# Patient Record
Sex: Male | Born: 1985 | Race: White | Hispanic: No | Marital: Single | Smoking: Current every day smoker
Health system: Southern US, Community
[De-identification: ages and names within clinical notes are randomized; demographics above are authoritative.]

## PROBLEM LIST (undated history)

## (undated) DIAGNOSIS — F909 Attention-deficit hyperactivity disorder, unspecified type: Secondary | ICD-10-CM

## (undated) DIAGNOSIS — F329 Major depressive disorder, single episode, unspecified: Secondary | ICD-10-CM

## (undated) DIAGNOSIS — F32A Depression, unspecified: Secondary | ICD-10-CM

---

## 2013-06-10 ENCOUNTER — Ambulatory Visit (INDEPENDENT_AMBULATORY_CARE_PROVIDER_SITE_OTHER): Payer: BC Managed Care – PPO | Admitting: Emergency Medicine

## 2013-06-10 VITALS — BP 126/78 | HR 115 | Temp 98.7°F | Resp 17 | Ht 73.5 in | Wt 256.0 lb

## 2013-06-10 DIAGNOSIS — Z0000 Encounter for general adult medical examination without abnormal findings: Secondary | ICD-10-CM

## 2013-06-10 DIAGNOSIS — Z23 Encounter for immunization: Secondary | ICD-10-CM

## 2013-06-10 NOTE — Progress Notes (Signed)
[facility] [address], [place] [id number] [phone]  Date:  06/10/2013   Name:  Chad RummageSteven T Conrad   DOB:  12/16/1985   MRN:  [id number]  PCP:  Pcp Not In System    Chief Complaint: Employment Physical and Immunizations   History of Present Illness:  Chad RummageSteven T Conrad is a 28 y.o. very pleasant male patient who presents with the following:  Physical for school.  Denies other complaint or health concern today.   There are no active problems to display for this patient.   History reviewed. No pertinent past medical history.  History reviewed. No pertinent past surgical history.  History  Substance Use Topics  . Smoking status: Current Every Day Smoker -- 0.30 packs/day for 10 years    Types: Cigarettes  . Smokeless tobacco: Not on file  . Alcohol Use: No    Family History  Problem Relation Age of Onset  . Hyperlipidemia Father   . Hypertension Father     No Known Allergies  Medication list has been reviewed and updated.  No current outpatient prescriptions on file prior to visit.   No current facility-administered medications on file prior to visit.    Review of Systems:  As per HPI, otherwise negative.    Physical Examination: Filed Vitals:   06/10/13 1832  BP: 126/78  Pulse: 115  Temp: 98.7 F (37.1 C)  Resp: 17   Filed Vitals:   06/10/13 1832  Height: 6' 1.5" (1.867 m)  Weight: 256 lb (116.121 kg)   Body mass index is 33.31 kg/(m^2). Ideal Body Weight: Weight in (lb) to have BMI = 25: 191.7  GEN: WDWN, NAD, Non-toxic, A & O x 3 HEENT: Atraumatic, Normocephalic. Neck supple. No masses, No LAD. Ears and Nose: No external deformity. CV: RRR, No M/G/R. No JVD. No thrill. No extra heart sounds. PULM: CTA B, no wheezes, crackles, rhonchi. No retractions. No resp. distress. No accessory muscle use. ABD: S, NT, ND, +BS. No rebound. No HSM. EXTR: No c/c/e NEURO Normal gait.  PSYCH: Normally interactive. Conversant. Not  depressed or anxious appearing.  Calm demeanor.    Assessment and Plan: Needs TDAP and Varicella  Signed,  Phillips OdorJeffery Nikolos Billig, MD

## 2013-06-12 ENCOUNTER — Encounter: Payer: Self-pay | Admitting: *Deleted

## 2013-06-12 LAB — VARICELLA ZOSTER ANTIBODY, IGG: Varicella IgG: 2077 Index — ABNORMAL HIGH (ref <135.00)

## 2015-10-28 ENCOUNTER — Ambulatory Visit (HOSPITAL_COMMUNITY)
Admission: RE | Admit: 2015-10-28 | Discharge: 2015-10-28 | Disposition: A | Discharge status: Home / Self Care | Payer: Self-pay | Source: Ambulatory Visit | Attending: Occupational Medicine | Admitting: Occupational Medicine

## 2015-10-28 ENCOUNTER — Other Ambulatory Visit: Payer: Self-pay | Admitting: Occupational Medicine

## 2015-10-28 DIAGNOSIS — R7611 Nonspecific reaction to tuberculin skin test without active tuberculosis: Secondary | ICD-10-CM

## 2015-11-20 ENCOUNTER — Inpatient Hospital Stay (HOSPITAL_COMMUNITY): Payer: Self-pay

## 2015-11-20 ENCOUNTER — Emergency Department (HOSPITAL_COMMUNITY): Payer: Self-pay

## 2015-11-20 ENCOUNTER — Encounter (HOSPITAL_COMMUNITY): Payer: Self-pay | Admitting: *Deleted

## 2015-11-20 ENCOUNTER — Inpatient Hospital Stay (HOSPITAL_COMMUNITY)
Admission: EM | Admit: 2015-11-20 | Discharge: 2015-11-21 | DRG: 917 | Disposition: A | Discharge status: Home / Self Care | Payer: Self-pay | Attending: Internal Medicine | Admitting: Internal Medicine

## 2015-11-20 DIAGNOSIS — G92 Toxic encephalopathy: Secondary | ICD-10-CM | POA: Diagnosis present

## 2015-11-20 DIAGNOSIS — F1490 Cocaine use, unspecified, uncomplicated: Secondary | ICD-10-CM | POA: Diagnosis present

## 2015-11-20 DIAGNOSIS — T50904A Poisoning by unspecified drugs, medicaments and biological substances, undetermined, initial encounter: Secondary | ICD-10-CM

## 2015-11-20 DIAGNOSIS — F1190 Opioid use, unspecified, uncomplicated: Secondary | ICD-10-CM | POA: Diagnosis present

## 2015-11-20 DIAGNOSIS — Z79899 Other long term (current) drug therapy: Secondary | ICD-10-CM

## 2015-11-20 DIAGNOSIS — N179 Acute kidney failure, unspecified: Secondary | ICD-10-CM | POA: Diagnosis present

## 2015-11-20 DIAGNOSIS — F17210 Nicotine dependence, cigarettes, uncomplicated: Secondary | ICD-10-CM | POA: Diagnosis present

## 2015-11-20 DIAGNOSIS — F1590 Other stimulant use, unspecified, uncomplicated: Secondary | ICD-10-CM | POA: Diagnosis present

## 2015-11-20 DIAGNOSIS — G929 Unspecified toxic encephalopathy: Secondary | ICD-10-CM | POA: Diagnosis present

## 2015-11-20 HISTORY — DX: Major depressive disorder, single episode, unspecified: F32.9

## 2015-11-20 HISTORY — DX: Attention-deficit hyperactivity disorder, unspecified type: F90.9

## 2015-11-20 HISTORY — DX: Depression, unspecified: F32.A

## 2015-11-20 LAB — BASIC METABOLIC PANEL
ANION GAP: 9 (ref 5–15)
Anion gap: 5 (ref 5–15)
BUN: 14 mg/dL (ref 6–20)
BUN: 12 mg/dL (ref 6–20)
CALCIUM: 9 mg/dL (ref 8.9–10.3)
CHLORIDE: 109 mmol/L (ref 101–111)
CO2: 27 mmol/L (ref 22–32)
CO2: 25 mmol/L (ref 22–32)
CREATININE: 0.95 mg/dL (ref 0.61–1.24)
Calcium: 7.8 mg/dL — ABNORMAL LOW (ref 8.9–10.3)
Chloride: 101 mmol/L (ref 101–111)
Creatinine, Ser: 1.26 mg/dL — ABNORMAL HIGH (ref 0.61–1.24)
GFR calc Af Amer: >60 mL/min (ref >60)
GFR calc Af Amer: >60 mL/min (ref >60)
GFR calc non Af Amer: >60 mL/min (ref >60)
Glucose, Bld: 212 mg/dL — ABNORMAL HIGH (ref 65–99)
Glucose, Bld: 111 mg/dL — ABNORMAL HIGH (ref 65–99)
POTASSIUM: 3 mmol/L — AB (ref 3.5–5.1)
Potassium: 4 mmol/L (ref 3.5–5.1)
SODIUM: 137 mmol/L (ref 135–145)
SODIUM: 139 mmol/L (ref 135–145)

## 2015-11-20 LAB — HEPATIC FUNCTION PANEL
ALBUMIN: 4.3 g/dL (ref 3.5–5.0)
ALT: 30 U/L (ref 17–63)
AST: 47 U/L — AB (ref 15–41)
Alkaline Phosphatase: 93 U/L (ref 38–126)
BILIRUBIN TOTAL: 0.6 mg/dL (ref 0.3–1.2)
Bilirubin, Direct: <0.1 mg/dL — ABNORMAL LOW (ref 0.1–0.5)
TOTAL PROTEIN: 7.7 g/dL (ref 6.5–8.1)

## 2015-11-20 LAB — RAPID URINE DRUG SCREEN, HOSP PERFORMED
Amphetamines: POSITIVE — AB
BENZODIAZEPINES: POSITIVE — AB
Barbiturates: NOT DETECTED
COCAINE: POSITIVE — AB
Opiates: POSITIVE — AB
Tetrahydrocannabinol: NOT DETECTED

## 2015-11-20 LAB — CBC WITH DIFFERENTIAL/PLATELET
BASOS ABS: 0 10*3/uL (ref 0.0–0.1)
BASOS PCT: 0 %
EOS ABS: 0.1 10*3/uL (ref 0.0–0.7)
EOS PCT: 1 %
HCT: 46.6 % (ref 39.0–52.0)
Hemoglobin: 15.4 g/dL (ref 13.0–17.0)
Lymphocytes Relative: 17 %
Lymphs Abs: 3.7 10*3/uL (ref 0.7–4.0)
MCH: 30 pg (ref 26.0–34.0)
MCHC: 33 g/dL (ref 30.0–36.0)
MCV: 90.7 fL (ref 78.0–100.0)
Monocytes Absolute: 0.9 10*3/uL (ref 0.1–1.0)
Monocytes Relative: 4 %
Neutro Abs: 16.6 10*3/uL — ABNORMAL HIGH (ref 1.7–7.7)
Neutrophils Relative %: 78 %
PLATELETS: 226 10*3/uL (ref 150–400)
RBC: 5.14 MIL/uL (ref 4.22–5.81)
RDW: 13.2 % (ref 11.5–15.5)
WBC: 21.3 10*3/uL — ABNORMAL HIGH (ref 4.0–10.5)

## 2015-11-20 LAB — MRSA PCR SCREENING: MRSA by PCR: NEGATIVE

## 2015-11-20 LAB — CK: CK TOTAL: 246 U/L (ref 49–397)

## 2015-11-20 LAB — MAGNESIUM: MAGNESIUM: 1.7 mg/dL (ref 1.7–2.4)

## 2015-11-20 LAB — ACETAMINOPHEN LEVEL: Acetaminophen (Tylenol), Serum: <10 ug/mL — ABNORMAL LOW (ref 10–30)

## 2015-11-20 LAB — PHOSPHORUS: Phosphorus: 2.7 mg/dL (ref 2.5–4.6)

## 2015-11-20 LAB — ETHANOL

## 2015-11-20 LAB — SALICYLATE LEVEL

## 2015-11-20 MED ORDER — SODIUM CHLORIDE 0.9 % IV BOLUS (SEPSIS)
1000.0000 mL | Freq: Once | INTRAVENOUS | Status: AC
  Administered 2015-11-20: 1000 mL via INTRAVENOUS

## 2015-11-20 MED ORDER — NALOXONE HCL 2 MG/2ML IJ SOSY
1.0000 mg/h | PREFILLED_SYRINGE | INTRAVENOUS | Status: DC
  Administered 2015-11-20: 0.5 mg/h via INTRAVENOUS
  Administered 2015-11-20 – 2015-11-21 (×6): 1 mg/h via INTRAVENOUS
  Filled 2015-11-20 (×7): qty 4

## 2015-11-20 MED ORDER — NALOXONE HCL 2 MG/2ML IJ SOSY
1.0000 mg | PREFILLED_SYRINGE | Freq: Once | INTRAMUSCULAR | Status: AC
  Administered 2015-11-20: 1 mg via INTRAVENOUS
  Filled 2015-11-20: qty 2

## 2015-11-20 MED ORDER — IBUPROFEN 200 MG PO TABS
400.0000 mg | ORAL_TABLET | Freq: Four times a day (QID) | ORAL | Status: DC | PRN
  Administered 2015-11-20 – 2015-11-21 (×2): 400 mg via ORAL
  Filled 2015-11-20 (×2): qty 2

## 2015-11-20 MED ORDER — NALOXONE HCL 2 MG/2ML IJ SOSY
PREFILLED_SYRINGE | INTRAMUSCULAR | Status: AC
  Administered 2015-11-20: 1 mg
  Filled 2015-11-20: qty 2

## 2015-11-20 MED ORDER — ENOXAPARIN SODIUM 40 MG/0.4ML ~~LOC~~ SOLN
40.0000 mg | SUBCUTANEOUS | Status: DC
  Administered 2015-11-20 – 2015-11-21 (×2): 40 mg via SUBCUTANEOUS
  Filled 2015-11-20 (×2): qty 0.4

## 2015-11-20 MED ORDER — NALOXONE HCL 2 MG/2ML IJ SOSY
1.0000 mg | PREFILLED_SYRINGE | Freq: Once | INTRAMUSCULAR | Status: AC
  Administered 2015-11-20: 1 mg via INTRAVENOUS

## 2015-11-20 MED ORDER — SODIUM CHLORIDE 0.9 % IV SOLN: 250.0000 mL | INTRAVENOUS | Status: DC | PRN

## 2015-11-20 NOTE — Progress Notes (Addendum)
[facility]  Chart reviewed, admitted overnight with polysubstance abuse/encephalopathy Better this morning on narcan gtt  Advance diet when more awake Will check hiv and hepatitis panel per patient request  [facility]  Heber CarolinaBrent McQuaid, MD [place] [facility] Pager: [phone] Cell: [phone] After 3pm or if no response, call [phone]

## 2015-11-20 NOTE — ED Triage Notes (Signed)
Per EMS found unresponsive by friends, CPR performed by bystanders, Pt given Narcan 1mg  IV by EMS. Pt combative after Narcan, pt pulled out EJ and spitting. Per EMS fresh needle marks noted.

## 2015-11-20 NOTE — Consult Note (Signed)
PULMONARY / CRITICAL CARE MEDICINE   Name: Chad Conrad MRN: [id number] DOB: February 21, 1986    ADMISSION DATE:  11/20/2015 CONSULTATION DATE:  @TODAY @   REFERRING MD:    CHIEF COMPLAINT:  Encephalopathy  HISTORY OF PRESENT ILLNESS:   Chad Conrad is a 30 year old male who presents for acute encephalopathy. The patient works as an Charity fundraiser in [employer]. He presented by EMS when friends called for unresponsiveness. There is some suggestion of CPR, but upon arrival EMS found him to have a pulse, but a depressed respiratory rate. The patient was brought to [facility] ED for further evaluation. Labs with leukocytosis, no acute renal failure, or electrolyte abnormalities, however patient is obtunded with minimal response to narcan. Drip has been initiated yet the patient continues with labored sonorous breathing.   UDS positive for amphetamines, benzos, cocaine, opiates.  EKG reviewed and not consistent with ACS. No peaked T waves or TWI. CXR without cardiopulmonary abnormality. No prior ECHO or PFTs.   Upon my arrival patient is being transported to CT scan with deeply sonorus breathing and apparent aspirate material in ventimask. Upon my full examination, with vigorous agitation they patient awakes. After I tell him that he may need to be intubated, he raises his head off the bed and say 'ok ok ok, i'm up'.  PAST MEDICAL HISTORY :  He  has no past medical history on file.  PAST SURGICAL HISTORY: He  has no past surgical history on file.  No Known Allergies  No current facility-administered medications on file prior to encounter.    Current Outpatient Prescriptions on File Prior to Encounter  Medication Sig  . amphetamine-dextroamphetamine (ADDERALL XR) 20 MG 24 hr capsule Take 20 mg by mouth daily.  . DULoxetine (CYMBALTA) 30 MG capsule Take 30 mg by mouth daily.    FAMILY HISTORY:  His indicated that his mother is alive. He indicated that his father is alive.    SOCIAL HISTORY: He   reports that he has been smoking Cigarettes.  He has a 3.00 pack-year smoking history. He does not have any smokeless tobacco history on file. He reports that he does not drink alcohol or use drugs.  REVIEW OF SYSTEMS:   Unable to obtain 2/2 no friends or family present, patient is acutely encephalopathic  VITAL SIGNS: BP 110/83   Pulse 106   Resp 17   SpO2 100%   HEMODYNAMICS:  stable  VENTILATOR SETTINGS:  non rebreather  INTAKE / OUTPUT: No intake/output data recorded.  PHYSICAL EXAMINATION: Physical Exam: Pulse Rate:  [97-106] 106 (09/09 0559) Resp:  [17-29] 17 (09/09 0559) BP: (90-114)/(61-83) 110/83 (09/09 0559) SpO2:  [91 %-100 %] 100 % (09/09 0559)  General Well nourished, obese, altered.  HEENT No gross abnormalities. Large neck  Pulmonary Clear to auscultation bilaterally with no wheezes, rales or ronchi. Good effort, symmetrical expansion.   Cardiovascular Normal rate, regular rhythm. S1, s2. No m/r/g. Distal pulses palpable.  Abdomen Soft, non-tender, non-distended, positive bowel sounds, no palpable organomegaly or masses. Normoresonant to percussion.  Musculoskeletal Patient does not participate in clinical examination 2/2 mental status  Lymphatics No cervical, supraclavicular or axillary adenopathy.   Neurologic Non responsive to verbal stimuli, responsive to pain  Skin/Integuement Track marks on bilateral upper extremities   LABS:  BMET  Recent Labs Lab 11/20/15 0335  NA 137  K 3.0*  CL 101  CO2 27  BUN 14  CREATININE 1.26*  GLUCOSE 212*    Electrolytes  Recent Labs Lab  11/20/15 0335  CALCIUM 9.0    CBC  Recent Labs Lab 11/20/15 0335  WBC 21.3*  HGB 15.4  HCT 46.6  PLT 226    Coag's No results for input(s): APTT, INR in the last 168 hours.  Sepsis Markers No results for input(s): LATICACIDVEN, PROCALCITON, O2SATVEN in the last 168 hours.  ABG No results for input(s): PHART, PCO2ART, PO2ART in the last 168 hours.  Liver  Enzymes No results for input(s): AST, ALT, ALKPHOS, BILITOT, ALBUMIN in the last 168 hours.  Cardiac Enzymes No results for input(s): TROPONINI, PROBNP in the last 168 hours.  Glucose No results for input(s): GLUCAP in the last 168 hours.  Imaging Dg Chest Port 1 View  Result Date: 11/20/2015 CLINICAL DATA:  Patient found unresponsive. Status post CPR. Initial encounter. EXAM: PORTABLE CHEST 1 VIEW COMPARISON:  Chest radiograph performed 10/28/2015 FINDINGS: The lungs are well-aerated. Mild peribronchial thickening is noted. There is no evidence of focal opacification, pleural effusion or pneumothorax. The cardiomediastinal silhouette is within normal limits. No acute osseous abnormalities are seen. IMPRESSION: Mild peribronchial thickening noted.  Lungs otherwise grossly clear. Electronically Signed   By: Roanna RaiderJeffery  Chang M.D.   On: 11/20/2015 03:48    SIGNIFICANT EVENTS: Patient found unresponsive by friends. CPR administered for undetermined amount of time LINES/TUBES: PIV  DISCUSSION: Chad Conrad is a 30 year old male who presents with acute toxic encephalopathy 2/2 drug overdose. Initial examination concerning for airway protection, however the patient is now responding to verbal stimuli. ED and I have decided to defer intubation at this time.  ASSESSMENT / PLAN:  PULMONARY A: awake, alert but low threshold for intubation P:  Will monitor closely, patient may need to be intubated, and would use propofol or ketamine as patient is on narcan gtt. No signs of aspiration pneumonia- will defer antiobiotics at this time  CARDIOVASCULAR A: CPR initiated by history, but I his decreased respirations were driven by toxicology P: ECHO in am  RENAL A:  Mild AKI P:  Resuscitate with IVF  Rhabdo labs pending  GASTROINTESTINAL A:  Insert OG P:  Initiate tube feeds  INFECTIOUS A:  I suspect leukocytosis is reactive, no evidence of aspiration pneumonia or pnuemonitis P:  Repeat CBC in 8  hours  ENDOCRINE A:  No apparent endocrinopathies P: daily bmp for glucose monitoring  NEUROLOGIC A:  Toxic encephalopathy P:   Continue narcan gtt  FAMILY  - Updates:   - Inter-disciplinary family meet or Palliative Care meeting due by:  day 7   The patient is critically ill with multiple organ system failure and requires high complexity decision making for assessment and support, frequent evaluation and titration of therapies, advanced monitoring, review of radiographic studies and interpretation of complex data.   Critical Care Time devoted to patient care services, exclusive of separately billable procedures, described in this note is 35 minutes.   Greta DoomAlexis C Bensen Chadderdon DO Pulmonary and Critical Care Medicine [facility] Pager: [phone]  11/20/2015, 6:08 AM

## 2015-11-20 NOTE — ED Provider Notes (Signed)
[facility]-EMERGENCY DEPT Provider Note   CSN: [id number] Arrival date & time: 11/20/15  0309 By signing my name below, I, Bridgette HabermannMaria Tan, attest that this documentation has been prepared under the direction and in the presence of Gilda Creasehristopher J Jobany Montellano, MD. Electronically Signed: Bridgette HabermannMaria Tan, ED Scribe. 11/20/15. 3:28 AM.  History   Chief Complaint Chief Complaint  Patient presents with  . Drug Overdose   LEVEL 5 CAVEAT: HPI and ROS limited due to AMS.  HPI Comments: Chad Conrad is a 30 y.o. male who presents to the Emergency Department by EMS for an overdose just PTA. Per EMS, pt was found unresponsive by his friends and CPR was performed by bystanders. Fresh needle marks are present on his arm. Pt was given Narcan 1mg  IV by EMS. Pt unwilling to follow commands during examination. Pt appears to be in no acute distress at this time.  The history is provided by the patient and the EMS personnel. No language interpreter was used.    No past medical history on file.  There are no active problems to display for this patient.   No past surgical history on file.     Home Medications    Prior to Admission medications   Medication Sig Start Date End Date Taking? Authorizing Provider  amphetamine-dextroamphetamine (ADDERALL XR) 20 MG 24 hr capsule Take 20 mg by mouth daily.    Historical Provider, MD  DULoxetine (CYMBALTA) 30 MG capsule Take 30 mg by mouth daily.    Historical Provider, MD    Family History Family History  Problem Relation Age of Onset  . Hyperlipidemia Father   . Hypertension Father     Social History Social History  Substance Use Topics  . Smoking status: Current Every Day Smoker    Packs/day: 0.30    Years: 10.00    Types: Cigarettes  . Smokeless tobacco: Not on file  . Alcohol use No     Allergies   Review of patient's allergies indicates no known allergies.   Review of Systems Review of Systems  Unable to perform ROS: Mental status change    Physical Exam Updated Vital Signs BP 110/83   Pulse 106   Resp 17   SpO2 100%   Physical Exam  Constitutional: He appears well-developed and well-nourished. He appears lethargic. He appears distressed.  HENT:  Head: Normocephalic and atraumatic.  Right Ear: Hearing normal.  Left Ear: Hearing normal.  Nose: Nose normal.  Mouth/Throat: Oropharynx is clear and moist and mucous membranes are normal.  Eyes: Conjunctivae and EOM are normal. Pupils are equal, round, and reactive to light.  Neck: Normal range of motion. Neck supple.  Cardiovascular: Regular rhythm, S1 normal and S2 normal.  Exam reveals no gallop and no friction rub.   No murmur heard. Pulmonary/Chest: Effort normal and breath sounds normal. No respiratory distress. He exhibits no tenderness.  Abdominal: Soft. Normal appearance and bowel sounds are normal. There is no hepatosplenomegaly. There is no tenderness. There is no rebound, no guarding, no tenderness at McBurney's point and negative Murphy's sign. No hernia.  Musculoskeletal: Normal range of motion.  Neurological: He has normal strength. He appears lethargic. No cranial nerve deficit or sensory deficit. Coordination normal. GCS eye subscore is 3. GCS verbal subscore is 3. GCS motor subscore is 5.  Skin: Skin is warm, dry and intact. No rash noted. No cyanosis.  Mildly diaphoretic  Psychiatric: He is agitated. He is noncommunicative.  Nursing note and vitals reviewed.    ED  Treatments / Results  DIAGNOSTIC STUDIES: Oxygen Saturation is 98% on RA, normal by my interpretation.    COORDINATION OF CARE: 3:18 AM Discussed treatment plan with pt at bedside and pt agreed to plan.  Labs (all labs ordered are listed, but only abnormal results are displayed) Labs Reviewed  CBC WITH DIFFERENTIAL/PLATELET - Abnormal; Notable for the following:       Result Value   WBC 21.3 (*)    Neutro Abs 16.6 (*)    All other components within normal limits  BASIC METABOLIC  PANEL - Abnormal; Notable for the following:    Potassium 3.0 (*)    Glucose, Bld 212 (*)    Creatinine, Ser 1.26 (*)    All other components within normal limits  URINE RAPID DRUG SCREEN, HOSP PERFORMED - Abnormal; Notable for the following:    Opiates POSITIVE (*)    Cocaine POSITIVE (*)    Benzodiazepines POSITIVE (*)    Amphetamines POSITIVE (*)    All other components within normal limits  ACETAMINOPHEN LEVEL - Abnormal; Notable for the following:    Acetaminophen (Tylenol), Serum <10 (*)    All other components within normal limits  HEPATIC FUNCTION PANEL - Abnormal; Notable for the following:    AST 47 (*)    Bilirubin, Direct <0.1 (*)    All other components within normal limits  ETHANOL  SALICYLATE LEVEL    EKG  EKG Interpretation None       Radiology Dg Chest Port 1 View  Result Date: 11/20/2015 CLINICAL DATA:  Patient found unresponsive. Status post CPR. Initial encounter. EXAM: PORTABLE CHEST 1 VIEW COMPARISON:  Chest radiograph performed 10/28/2015 FINDINGS: The lungs are well-aerated. Mild peribronchial thickening is noted. There is no evidence of focal opacification, pleural effusion or pneumothorax. The cardiomediastinal silhouette is within normal limits. No acute osseous abnormalities are seen. IMPRESSION: Mild peribronchial thickening noted.  Lungs otherwise grossly clear. Electronically Signed   By: Roanna Raider M.D.   On: 11/20/2015 03:48    Procedures Procedures (including critical care time)  Medications Ordered in ED Medications  naloxone (NARCAN) 4 mg in dextrose 5 % 250 mL infusion (1 mg/hr Intravenous Rate/Dose Change 11/20/15 0509)  naloxone [facility]) 2 MG/2ML injection (1 mg  Given 11/20/15 0337)  naloxone [facility]) injection 1 mg (1 mg Intravenous Given 11/20/15 0405)  naloxone (NARCAN) injection 1 mg (1 mg Intravenous Given 11/20/15 0509)     Initial Impression / Assessment and Plan / ED Course  I have reviewed the triage vital signs and the  nursing notes.  Pertinent labs & imaging results that were available during my care of the patient were reviewed by me and considered in my medical decision making (see chart for details).  Clinical Course   Patient was brought to emergency department by ambulance.Patient was found on the ground by friends. He reportedly received CPR by bystanders before EMS arrived. It is unknown how long he was  Lying on the floor.EMS identified obvious track marks on the patient.He was given Narcan and did have some arousability but became extremely agitated. At arrival to the ER patient was  Exhibiting severe somnolence, snoring respirations.He did respond to deep stimuli and was clearing his airway and spitting intermittently, therefore did not require immediate intubation.He was administered boluses of Narcan with only partial response. He was then placed on a Narcan drip which has been titrated upwards. He is more arousable, but still extremely sedated. He will require hospitalization. Critical care has been consulted  and will evaluate the patient.  CRITICAL CARE Performed by: Gilda Crease   Total critical care time: 35 minutes  Critical care time was exclusive of separately billable procedures and treating other patients.  Critical care was necessary to treat or prevent imminent or life-threatening deterioration.  Critical care was time spent personally by me on the following activities: development of treatment plan with patient and/or surrogate as well as nursing, discussions with consultants, evaluation of patient's response to treatment, examination of patient, obtaining history from patient or surrogate, ordering and performing treatments and interventions, ordering and review of laboratory studies, ordering and review of radiographic studies, pulse oximetry and re-evaluation of patient's condition.   Final Clinical Impressions(s) / ED Diagnoses   Final diagnoses:  Overdose,  undetermined intent, initial encounter    New Prescriptions New Prescriptions   No medications on file  I personally performed the services described in this documentation, which was scribed in my presence. The recorded information has been reviewed and is accurate.     Gilda Crease, MD 11/20/15 [phone]

## 2015-11-20 NOTE — ED Notes (Signed)
Patient transported to CT 

## 2015-11-20 NOTE — Progress Notes (Signed)
Pt requested to be confidential status.  Informed Dr. Kendrick FriesMcquaid.  Erick Blinksuchman, Gianlucca Szymborski D, RN

## 2015-11-20 NOTE — Progress Notes (Signed)
eLink Physician-Brief Progress Note Patient Name: Chad Conrad DOB: 08/03/1985 MRN: [id number]   Date of Service  11/20/2015  HPI/Events of Note  Patient c/o chest pain related to CPR.  eICU Interventions  Will order: 1. Motrin 400 mg PO Q 6 hours PRN pain.      Intervention Category Intermediate Interventions: Pain - evaluation and management  Chad Conrad,Chad Conrad 11/20/2015, 7:42 PM

## 2015-11-20 NOTE — ED Notes (Signed)
Entered to see pt, pt opened eyes spontaneously to verbal conversation. When asked questions pt moans. Unable to assess orientation status related to pt moaning. Pt lifts self when asked to sit forward for chest x-ray. Chest x-ray in process pt to be transported in 20 minutes to floor.

## 2015-11-21 ENCOUNTER — Encounter (HOSPITAL_COMMUNITY): Payer: Self-pay

## 2015-11-21 DIAGNOSIS — G92 Toxic encephalopathy: Secondary | ICD-10-CM

## 2015-11-21 LAB — HIV ANTIBODY (ROUTINE TESTING W REFLEX): HIV Screen 4th Generation wRfx: NONREACTIVE

## 2015-11-21 MED ORDER — BUPROPION HCL ER (XL) 150 MG PO TB24: 150.0000 mg | ORAL_TABLET | Freq: Every day | ORAL | Status: DC

## 2015-11-21 MED ORDER — BUPROPION HCL ER (XL) 300 MG PO TB24
300.0000 mg | ORAL_TABLET | Freq: Every day | ORAL | Status: DC
Start: 2015-11-21 — End: 2015-11-21
  Administered 2015-11-21: 300 mg via ORAL
  Filled 2015-11-21: qty 1

## 2015-11-21 NOTE — Discharge Summary (Signed)
Discharge Summary  Chad KernSteven T XXXAndreoli WUJ:[id number] DOB: 09/16/1985  PCP: Pcp Not In System  Admit date: 11/20/2015 Discharge date: 11/21/2015   Recommendations for Outpatient Follow-up:  1. PCP 1-2 weeks   Discharge Diagnoses:  Active Hospital Problems   Diagnosis Date Noted  . Encephalopathy, toxic 11/20/2015    Resolved Hospital Problems   Diagnosis Date Noted Date Resolved  No resolved problems to display.    Discharge Condition: Stable   Diet recommendation: Reg   Vitals:   11/21/15 0410 11/21/15 0600  BP: 114/88 137/85  Pulse: 90 96  Resp: (!) 28 (!) 22  Temp: 98 F (36.7 C)     History of present illness and Hospital course:  [id number] male found down and unresponsive by friends after ingesting various recreational drugs. He admits to recent heroin, cocaine and meth use. He was admitted to ICU service and monitored closely, was placed on Narcan drip. This was discontinued on 9/10 at 8am. He is doing well. Patient was strongly counseled on the risks of continued drug abuse. He is not suicidal. He requested visit from social work for resources to help him quit drug abuse. He will be weaned off of supplemental oxygen and will be discharged home today at 5pm if he is doing well.  Hospital Course:  Active Problems:   Encephalopathy, toxic   Procedures:  None   Consultations:  None   Discharge Exam: BP 137/85 (BP Location: Left Wrist)   Pulse 96   Temp 98 F (36.7 C) (Oral)   Resp (!) 22   Ht 5\' 10"  (1.778 m)   Wt 135.9 kg (299 lb 9.7 oz)   SpO2 93%   BMI 42.99 kg/m  General:  Alert, oriented, calm, in no acute distress  Eyes: pupils round and reactive to light and accomodation, clear sclerea Neck: supple, no masses, trachea mildline  Cardiovascular: RRR, no murmurs or rubs, no peripheral edema  Respiratory: clear to auscultation bilaterally, no wheezes, no crackles  Abdomen: soft, nontender, nondistended, normal bowel tones heard  Skin: dry, no rashes    Musculoskeletal: no joint effusions, normal range of motion  Psychiatric: appropriate affect, normal speech  Neurologic: extraocular muscles intact, clear speech, moving all extremities with intact sensorium   Discharge Instructions You were cared for by a hospitalist during your hospital stay. If you have any questions about your discharge medications or the care you received while you were in the hospital after you are discharged, you can call the unit and asked to speak with the hospitalist on call if the hospitalist that took care of you is not available. Once you are discharged, your primary care physician will handle any further medical issues. Please note that NO REFILLS for any discharge medications will be authorized once you are discharged, as it is imperative that you return to your primary care physician (or establish a relationship with a primary care physician if you do not have one) for your aftercare needs so that they can reassess your need for medications and monitor your lab values.  Discharge Instructions    Diet - low sodium heart healthy    Complete by:  As directed   Increase activity slowly    Complete by:  As directed       Medication List    TAKE these medications   amphetamine-dextroamphetamine 20 MG tablet Commonly known as:  ADDERALL Take 10-20 mg by mouth 3 (three) times daily. Take 20 mgs twice daily and then 10mg s every evening  buPROPion 300 MG 24 hr tablet Commonly known as:  WELLBUTRIN XL Take 300 mg by mouth daily.      No Known Allergies    The results of significant diagnostics from this hospitalization (including imaging, microbiology, ancillary and laboratory) are listed below for reference.    Significant Diagnostic Studies: Dg Chest 2 View  Result Date: 10/29/2015 CLINICAL DATA:  Positive TB test. EXAM: CHEST  2 VIEW COMPARISON:  None. FINDINGS: The heart size and mediastinal contours are within normal limits. Both lungs are clear. The  visualized skeletal structures are unremarkable. IMPRESSION: No active cardiopulmonary disease. Electronically Signed   By: Charlett Nose M.D.   On: 10/29/2015 08:25   Ct Head Wo Contrast  Result Date: 11/20/2015 CLINICAL DATA:  Patient unresponsive, acute onset. Initial encounter. EXAM: CT HEAD WITHOUT CONTRAST CT CERVICAL SPINE WITHOUT CONTRAST TECHNIQUE: Multidetector CT imaging of the head and cervical spine was performed following the standard protocol without intravenous contrast. Multiplanar CT image reconstructions of the cervical spine were also generated. COMPARISON:  None. FINDINGS: CT HEAD FINDINGS Brain: No evidence of acute infarction, hemorrhage, hydrocephalus, extra-axial collection or mass lesion/mass effect. Mildly decreased attenuation at the right temporal lobe is thought to be artifactual in nature. The posterior fossa, including the cerebellum, brainstem and fourth ventricle, is within normal limits. The third and lateral ventricles, and basal ganglia are unremarkable in appearance. The cerebral hemispheres are symmetric in appearance, with normal gray-white differentiation. No mass effect or midline shift is seen. Vascular: No hyperdense vessel or unexpected calcification. Skull: There is no evidence of fracture; visualized osseous structures are unremarkable in appearance. Sinuses/Orbits: The orbits are within normal limits. The paranasal sinuses and mastoid air cells are well-aerated. Other: No significant soft tissue abnormalities are seen. CT CERVICAL SPINE FINDINGS Alignment: Likely normal, though not well assessed due to motion artifact. Skull base and vertebrae: No acute fracture. No primary bone lesion or focal pathologic process. Soft tissues and spinal canal: No prevertebral fluid or swelling. No visible canal hematoma. Disc levels:  Intervertebral disc spaces are grossly preserved. Upper chest: Fluffy biapical airspace opacities raise concern for pulmonary edema. The thyroid gland  is unremarkable in appearance. Other: No additional soft tissue abnormalities are characterized. IMPRESSION: 1. No evidence of traumatic intracranial injury or fracture. 2. No evidence of fracture or subluxation along the cervical spine, though evaluation is limited due to motion artifact. 3. Fluffy biapical airspace opacities raise concern for pulmonary edema. Electronically Signed   By: Roanna Raider M.D.   On: 11/20/2015 07:18   Ct Cervical Spine Wo Contrast  Result Date: 11/20/2015 CLINICAL DATA:  Patient unresponsive, acute onset. Initial encounter. EXAM: CT HEAD WITHOUT CONTRAST CT CERVICAL SPINE WITHOUT CONTRAST TECHNIQUE: Multidetector CT imaging of the head and cervical spine was performed following the standard protocol without intravenous contrast. Multiplanar CT image reconstructions of the cervical spine were also generated. COMPARISON:  None. FINDINGS: CT HEAD FINDINGS Brain: No evidence of acute infarction, hemorrhage, hydrocephalus, extra-axial collection or mass lesion/mass effect. Mildly decreased attenuation at the right temporal lobe is thought to be artifactual in nature. The posterior fossa, including the cerebellum, brainstem and fourth ventricle, is within normal limits. The third and lateral ventricles, and basal ganglia are unremarkable in appearance. The cerebral hemispheres are symmetric in appearance, with normal gray-white differentiation. No mass effect or midline shift is seen. Vascular: No hyperdense vessel or unexpected calcification. Skull: There is no evidence of fracture; visualized osseous structures are unremarkable in appearance. Sinuses/Orbits: The orbits  are within normal limits. The paranasal sinuses and mastoid air cells are well-aerated. Other: No significant soft tissue abnormalities are seen. CT CERVICAL SPINE FINDINGS Alignment: Likely normal, though not well assessed due to motion artifact. Skull base and vertebrae: No acute fracture. No primary bone lesion or  focal pathologic process. Soft tissues and spinal canal: No prevertebral fluid or swelling. No visible canal hematoma. Disc levels:  Intervertebral disc spaces are grossly preserved. Upper chest: Fluffy biapical airspace opacities raise concern for pulmonary edema. The thyroid gland is unremarkable in appearance. Other: No additional soft tissue abnormalities are characterized. IMPRESSION: 1. No evidence of traumatic intracranial injury or fracture. 2. No evidence of fracture or subluxation along the cervical spine, though evaluation is limited due to motion artifact. 3. Fluffy biapical airspace opacities raise concern for pulmonary edema. Electronically Signed   By: Roanna Raider M.D.   On: 11/20/2015 07:18   Dg Chest Port 1 View  Result Date: 11/20/2015 CLINICAL DATA:  Found unresponsive. CPR performed by by standard. Patient combative. EXAM: PORTABLE CHEST 1 VIEW COMPARISON:  11/20/2015 FINDINGS: Semi-erect AP portable chest. There is interim development of bilateral right greater than left pulmonary airspace opacities. No significant pleural effusion. Cardiomediastinal silhouette is nonenlarged. There is no pneumothorax. IMPRESSION: 1. Interim development of mild to moderate right greater than left diffuse pulmonary opacities. Findings could be secondary to edema, ARDS, or pulmonary hemorrhage. Electronically Signed   By: Jasmine Pang M.D.   On: 11/20/2015 08:28   Dg Chest Port 1 View  Result Date: 11/20/2015 CLINICAL DATA:  Patient found unresponsive. Status post CPR. Initial encounter. EXAM: PORTABLE CHEST 1 VIEW COMPARISON:  Chest radiograph performed 10/28/2015 FINDINGS: The lungs are well-aerated. Mild peribronchial thickening is noted. There is no evidence of focal opacification, pleural effusion or pneumothorax. The cardiomediastinal silhouette is within normal limits. No acute osseous abnormalities are seen. IMPRESSION: Mild peribronchial thickening noted.  Lungs otherwise grossly clear.  Electronically Signed   By: Roanna Raider M.D.   On: 11/20/2015 03:48    Microbiology: Recent Results (from the past 240 hour(s))  MRSA PCR Screening     Status: None   Collection Time: 11/20/15  8:44 AM  Result Value Ref Range Status   MRSA by PCR NEGATIVE NEGATIVE Final    Comment:        The GeneXpert MRSA Assay (FDA approved for NASAL specimens only), is one component of a comprehensive MRSA colonization surveillance program. It is not intended to diagnose MRSA infection nor to guide or monitor treatment for MRSA infections.      Labs: Basic Metabolic Panel:  Recent Labs Lab 11/20/15 0335 11/20/15 1001  NA 137 139  K 3.0* 4.0  CL 101 109  CO2 27 25  GLUCOSE 212* 111*  BUN 14 12  CREATININE 1.26* 0.95  CALCIUM 9.0 7.8*  MG  --  1.7  PHOS  --  2.7   Liver Function Tests:  Recent Labs Lab 11/20/15 0335  AST 47*  ALT 30  ALKPHOS 93  BILITOT 0.6  PROT 7.7  ALBUMIN 4.3   No results for input(s): LIPASE, AMYLASE in the last 168 hours. No results for input(s): AMMONIA in the last 168 hours. CBC:  Recent Labs Lab 11/20/15 0335  WBC 21.3*  NEUTROABS 16.6*  HGB 15.4  HCT 46.6  MCV 90.7  PLT 226   Cardiac Enzymes:  Recent Labs Lab 11/20/15 0355  CKTOTAL 246   BNP: BNP (last 3 results) No results for input(s): BNP  in the last 8760 hours.  ProBNP (last 3 results) No results for input(s): PROBNP in the last 8760 hours.  CBG: No results for input(s): GLUCAP in the last 168 hours.  Time spent: 34 minutes were spent in preparing this discharge including medication reconciliation, counseling, and coordination of care.  Signed:  Doshia Dalia Vergie Living  [facility] 11/21/2015, 12:05 PM

## 2015-11-21 NOTE — Progress Notes (Signed)
Discharge instructions reviewed with patient and his father utilizing teach back method no questions at this time. Patient being discharged to home with father.

## 2015-11-21 NOTE — Progress Notes (Signed)
Patient's oxygen level drops to 85% on room air when sleeping oxygen applied at 2 liters via nasal cannula. Patient states," he might have  some undiagnosed sleep apnea"

## 2015-11-21 NOTE — Clinical Social Work Note (Signed)
Clinical Social Work Assessment  Patient Details  Name: Chad Conrad MRN: [id number] Date of Birth: [birth year]  Date of referral:  11/21/15               Reason for consult:  Substance Use/ETOH Abuse                Permission sought to share information with:  Case Manager, Family Supports, Other (significant other ) Permission granted to share information::  Yes, Verbal Permission Granted  Name::        Agency::     Relationship::     Contact Information:     Housing/Transportation Living arrangements for the past 2 months:  Single Family Home Source of Information:  Patient Patient Interpreter Needed:  None Criminal Activity/Legal Involvement Pertinent to Current Situation/Hospitalization:  No - Comment as needed Significant Relationships:  Significant Other, Parents Lives with:  Self Do you feel safe going back to the place where you live?  Yes Need for family participation in patient care:  No (Coment)  Care giving concerns:  Patient shares that he has been a recovering herroine addict for 12 years, however used recently 2x.    Social Worker assessment / plan:  CSW met with patient at bedside who gave permission for csw to speak with him in front of father and signifcant other. Pt shares that he has been sober for 12 years of heroine however relapsed recently. Resident shared that he has been in multiple treatment venues. He recently began a job here at [employer] in August. He has been struggling hte past year and half with some mental health issues. Pt denies any SI/HI/AH/VH. Patient shares that he is interested in getting treatment possibly here or in [place]. Pt shares that he is not interested in following up with [facility] due to a past experience. Patient interested in national hotline 1-800-662-HELP [phone])510-6747) incase he does return to [place] with his dad.  Employment status:  [employer] information:  Managed Care PT Recommendations:    Information / Referral  to community resources:   Susbtance abuse outpatient and inpatient treatment  Patient/Family's Response to care:  Patient and patient family receptive of CSW help and support. Patient is unsure of where he stands with his signifcant other, and whether or not he will return to [place] with this father and seek treatment. Pt is concerned about his job.  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   Emotional Assessment Appearance:  Appears stated age Attitude/Demeanor/Rapport:  Apprehensive Affect (typically observed):  Accepting, Overwhelmed, Hopeful, Stable Orientation:  Oriented to Self, Oriented to Place, Oriented to  Time, Oriented to Situation Alcohol / Substance use:    Psych involvement (Current and /or in the community):  No (Comment)  Discharge Needs  Concerns to be addressed:  Coping/Stress Concerns, Mental Health Concerns Readmission within the last 30 days:  No Current discharge risk:  Substance Abuse Barriers to Discharge:  No Barriers Identified   Jaclynne Baldo, [place], LCSW 11/21/2015, 5:10 PM

## 2015-11-21 NOTE — Care Management Note (Signed)
Case Management Note  Patient Details  Name: Chad Conrad MRN: [id number] Date of Birth: 02/18/1986  Subjective/Objective:      Substance Use/ETOH abuse              Action/Plan: Discharge Planning: AVS reviewed:  NCM spoke to pt and father at bedside. Pt's girlfriend was in the room. Pt states he has PCP to follow up. Pt states he has insurance effective. Explained to pt that admissions would need copy of insurance card. Pt states he did not receive card. Insurance was effective 11/12/2015.   PCP- Sherlyn LickKelly Furim NP  Expected Discharge Date:  11/21/2015               Expected Discharge Plan:  Home/Self Care  In-House Referral:  NA  Discharge planning Services  CM Consult, [facility]  Post Acute Care Choice:  NA Choice offered to:     DME Arranged:  N/A DME Agency:  NA  HH Arranged:  NA HH Agency:  NA  Status of Service:  Completed, signed off  If discussed at Long Length of Stay Meetings, dates discussed:    Additional Comments:  Elliot CousinShavis, Tylique Aull Ellen, RN 11/21/2015, 5:17 PM

## 2015-11-22 LAB — HEPATITIS PANEL, ACUTE
HEP A IGM: NEGATIVE
Hep B C IgM: NEGATIVE
Hepatitis B Surface Ag: NEGATIVE

## 2017-12-06 IMAGING — CT CT HEAD W/O CM
6 of 14 series · 14 of 47 positions shown, 15 images · non-contrast
Comparison: None.

CLINICAL DATA: Patient unresponsive, acute onset. Initial
encounter.

EXAM:
CT HEAD WITHOUT CONTRAST
CT CERVICAL SPINE WITHOUT CONTRAST
TECHNIQUE: Multidetector CT imaging of the head and cervical spine was
performed following the standard protocol without intravenous
contrast. Multiplanar CT image reconstructions of the cervical spine
were also generated.

[Series 9: c-spine st · axial · 0.27mm/px · z∈[-296,-202]mm · 3 of 95 slices shown (1 of 2)]
[im 24/95  brain]
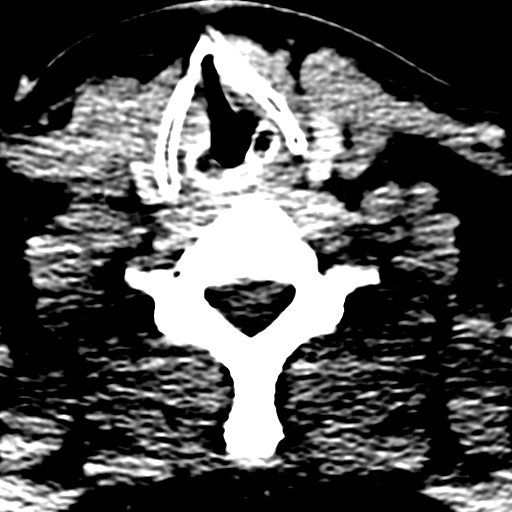
[im 48/95  brain]
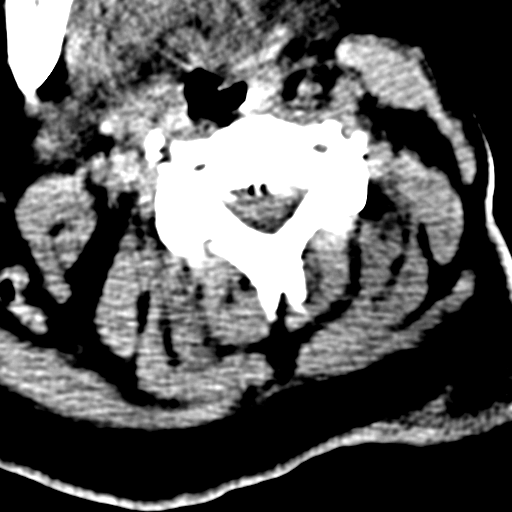
[im 71/95  brain]
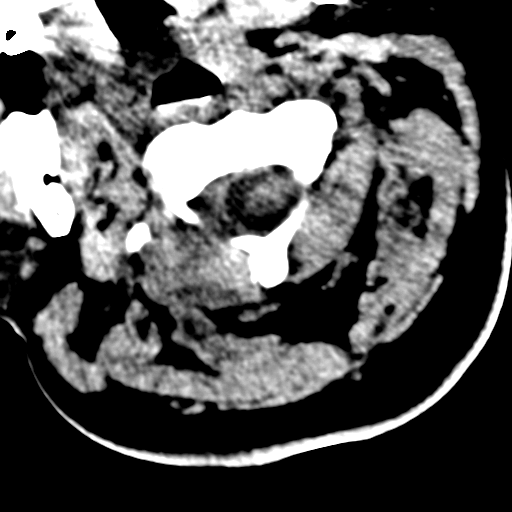

[Series 12: c-spine st · axial · 0.27mm/px · z∈[-274,-194]mm · 3 of 80 slices shown (2 of 2)]
[im 20/80  brain]
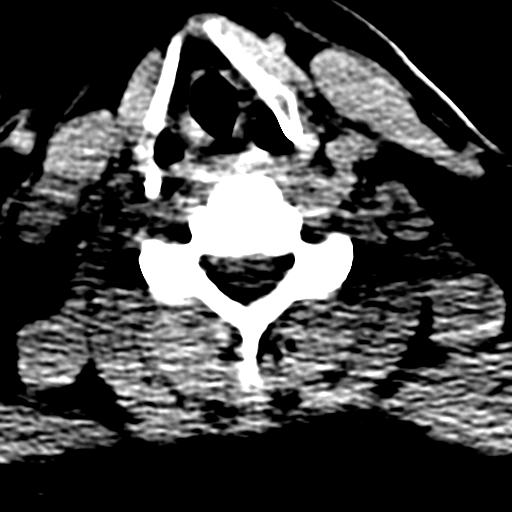
[im 40/80  brain]
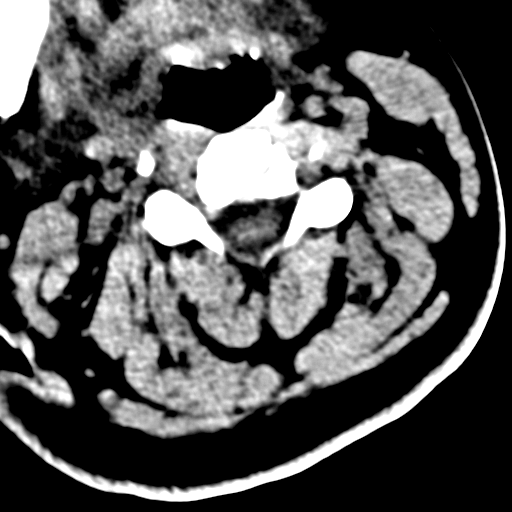
[im 60/80  brain]
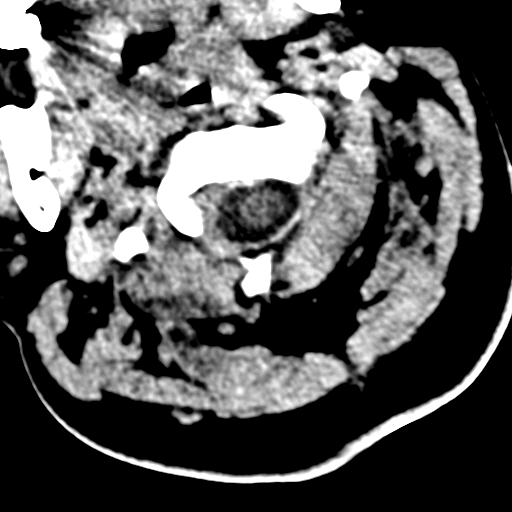

[Series 16: sagittal · sagittal · 0.32mm/px · 1 of 49 slices shown]
[im 25/49  brain]
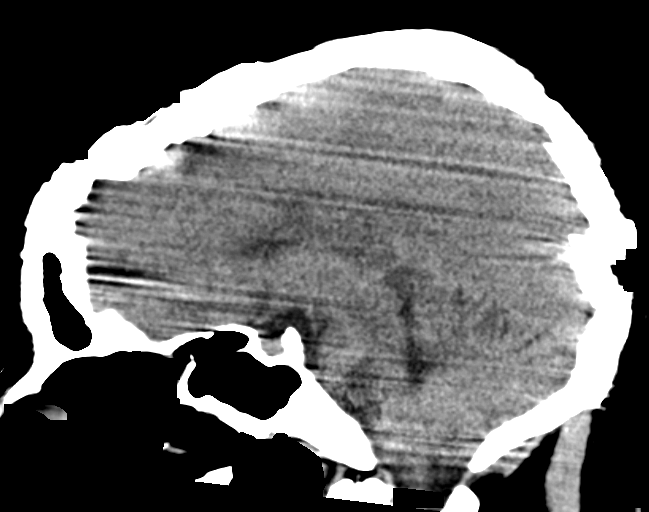

[Series 19: axial recon · axial · 0.23mm/px · z∈[-315,-233]mm · 3 of 96 slices shown, 4 images (1 of 2)]
[im 24/96  brain]
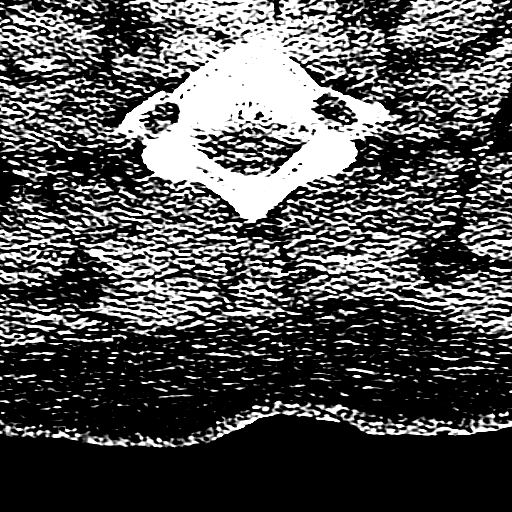
[im 24/96  bone]
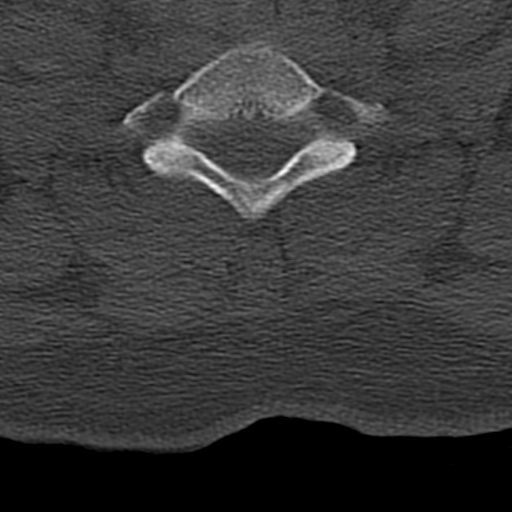
[im 48/96  brain]
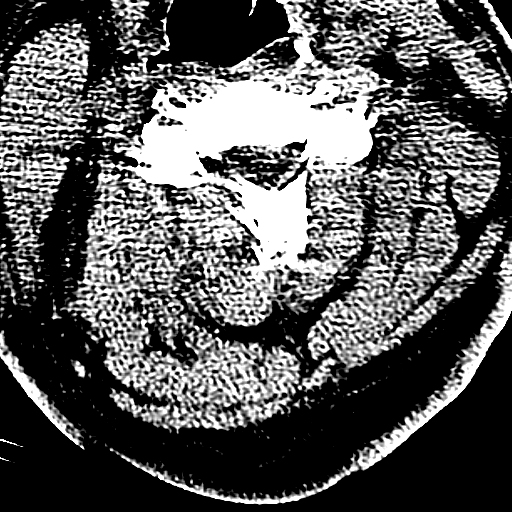
[im 72/96  brain]
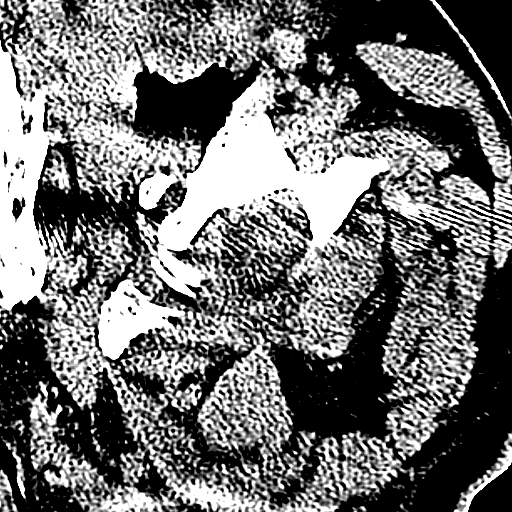

[Series 20: coronal · coronal · 0.28mm/px · 1 of 44 slices shown]
[im 22/44  brain]
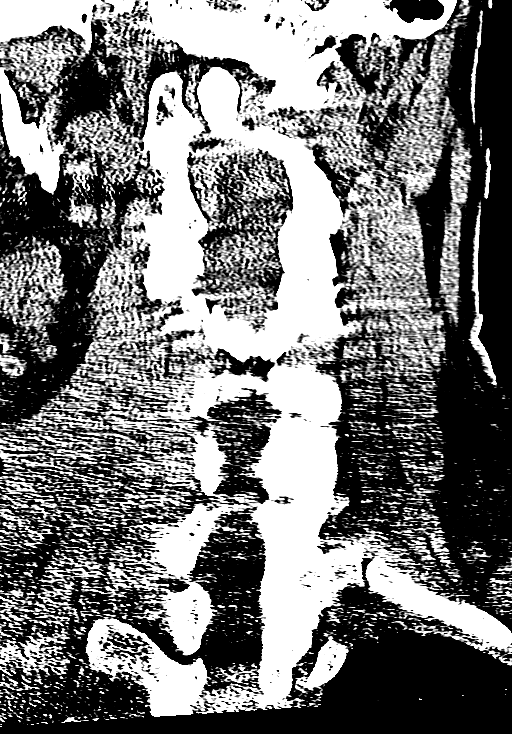

[Series 22: axial recon · axial · 0.23mm/px · z∈[-309,-231]mm · 3 of 89 slices shown (2 of 2)]
[im 23/89  brain]
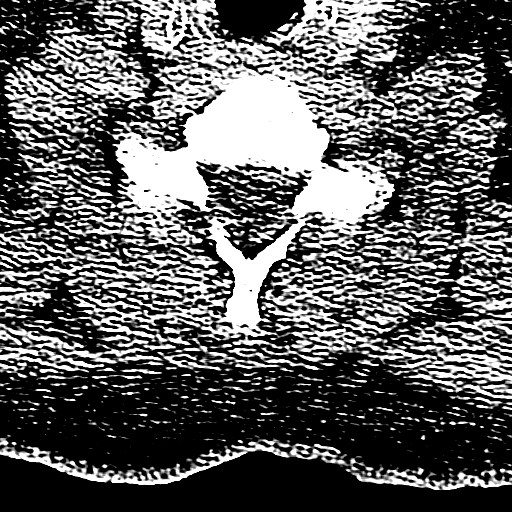
[im 45/89  brain]
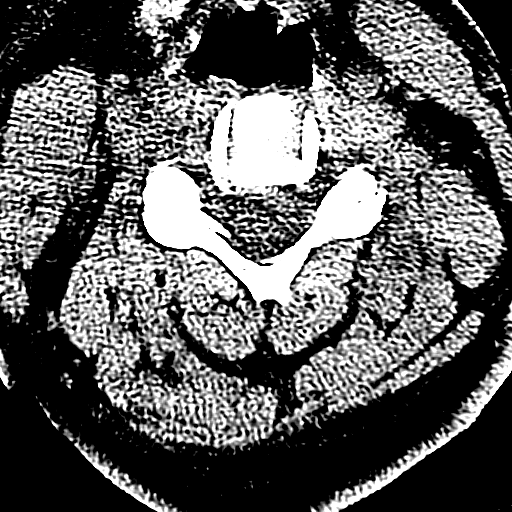
[im 67/89  brain]
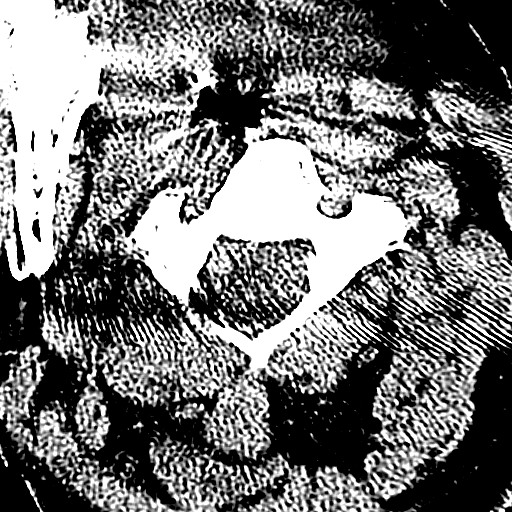

[14 of 47 positions shown; findings below may reference images not displayed]

FINDINGS: CT HEAD FINDINGS

Brain: No evidence of acute infarction, hemorrhage, hydrocephalus,
extra-axial collection or mass lesion/mass effect. Mildly decreased
attenuation at the right temporal lobe is thought to be artifactual
in nature.

The posterior fossa, including the cerebellum, brainstem and fourth
ventricle, is within normal limits. The third and lateral
ventricles, and basal ganglia are unremarkable in appearance. The
cerebral hemispheres are symmetric in appearance, with normal
gray-white differentiation. No mass effect or midline shift is seen.

Vascular: No hyperdense vessel or unexpected calcification.

Skull: There is no evidence of fracture; visualized osseous
structures are unremarkable in appearance.

Sinuses/Orbits: The orbits are within normal limits. The paranasal
sinuses and mastoid air cells are well-aerated.

Other: No significant soft tissue abnormalities are seen.

CT CERVICAL SPINE FINDINGS

Alignment: Likely normal, though not well assessed due to motion
artifact.

Skull base and vertebrae: No acute fracture. No primary bone lesion
or focal pathologic process.

Soft tissues and spinal canal: No prevertebral fluid or swelling. No
visible canal hematoma.

Disc levels:  Intervertebral disc spaces are grossly preserved.

Upper chest: Fluffy biapical airspace opacities raise concern for
pulmonary edema. The thyroid gland is unremarkable in appearance.

Other: No additional soft tissue abnormalities are characterized.
IMPRESSION: 1. No evidence of traumatic intracranial injury or fracture.
2. No evidence of fracture or subluxation along the cervical spine,
though evaluation is limited due to motion artifact.
3. Fluffy biapical airspace opacities raise concern for pulmonary
edema.

## 2020-09-10 DEATH — deceased
